# Patient Record
Sex: Male | Born: 1975 | Race: White | Hispanic: No | Marital: Married | Smoking: Never smoker
Health system: Southern US, Community
[De-identification: ages and names within clinical notes are randomized; demographics above are authoritative.]

## PROBLEM LIST (undated history)

## (undated) DIAGNOSIS — I10 Essential (primary) hypertension: Secondary | ICD-10-CM

---

## 2011-03-27 ENCOUNTER — Emergency Department: Payer: Self-pay | Admitting: Emergency Medicine

## 2011-03-27 LAB — COMPREHENSIVE METABOLIC PANEL
Albumin: 4.4 g/dL (ref 3.4–5.0)
Alkaline Phosphatase: 76 U/L (ref 50–136)
Anion Gap: 11 (ref 7–16)
Bilirubin,Total: 0.5 mg/dL (ref 0.2–1.0)
Calcium, Total: 9 mg/dL (ref 8.5–10.1)
Chloride: 105 mmol/L (ref 98–107)
Co2: 26 mmol/L (ref 21–32)
Creatinine: 1.1 mg/dL (ref 0.60–1.30)
EGFR (African American): >60
Glucose: 94 mg/dL (ref 65–99)
Osmolality: 282 (ref 275–301)
Potassium: 4 mmol/L (ref 3.5–5.1)
SGOT(AST): 27 U/L (ref 15–37)
Sodium: 142 mmol/L (ref 136–145)
Total Protein: 7.6 g/dL (ref 6.4–8.2)

## 2011-03-27 LAB — CBC
HCT: 45.5 % (ref 40.0–52.0)
HGB: 15.5 g/dL (ref 13.0–18.0)
MCH: 30.5 pg (ref 26.0–34.0)
MCHC: 34 g/dL (ref 32.0–36.0)
RBC: 5.07 10*6/uL (ref 4.40–5.90)
WBC: 8.4 10*3/uL (ref 3.8–10.6)

## 2011-03-27 LAB — URINALYSIS, COMPLETE
Bilirubin,UR: NEGATIVE
Ketone: NEGATIVE
Protein: NEGATIVE
RBC,UR: 9 /HPF (ref 0–5)
Specific Gravity: 1.017 (ref 1.003–1.030)

## 2011-03-27 LAB — HEMOGLOBIN A1C: Hemoglobin A1C: 5.8 % (ref 4.2–6.3)

## 2018-10-07 ENCOUNTER — Other Ambulatory Visit: Payer: Self-pay

## 2018-10-07 DIAGNOSIS — Z20822 Contact with and (suspected) exposure to covid-19: Secondary | ICD-10-CM

## 2018-10-08 LAB — NOVEL CORONAVIRUS, NAA: SARS-CoV-2, NAA: DETECTED — AB

## 2018-12-05 ENCOUNTER — Other Ambulatory Visit: Payer: Self-pay

## 2018-12-05 ENCOUNTER — Encounter: Payer: Self-pay | Admitting: Emergency Medicine

## 2018-12-05 ENCOUNTER — Emergency Department: Payer: Self-pay

## 2018-12-05 DIAGNOSIS — R079 Chest pain, unspecified: Secondary | ICD-10-CM | POA: Insufficient documentation

## 2018-12-05 DIAGNOSIS — R531 Weakness: Secondary | ICD-10-CM | POA: Insufficient documentation

## 2018-12-05 DIAGNOSIS — R0609 Other forms of dyspnea: Secondary | ICD-10-CM | POA: Insufficient documentation

## 2018-12-05 LAB — BASIC METABOLIC PANEL
Anion gap: 9 (ref 5–15)
BUN: 12 mg/dL (ref 6–20)
CO2: 24 mmol/L (ref 22–32)
Calcium: 9.2 mg/dL (ref 8.9–10.3)
Chloride: 106 mmol/L (ref 98–111)
Creatinine, Ser: 1.22 mg/dL (ref 0.61–1.24)
GFR calc Af Amer: >60 mL/min (ref >60)
GFR calc non Af Amer: >60 mL/min (ref >60)
Glucose, Bld: 92 mg/dL (ref 70–99)
Potassium: 3.9 mmol/L (ref 3.5–5.1)
Sodium: 139 mmol/L (ref 135–145)

## 2018-12-05 LAB — CBC
HCT: 43.8 % (ref 39.0–52.0)
Hemoglobin: 14.9 g/dL (ref 13.0–17.0)
MCH: 30.3 pg (ref 26.0–34.0)
MCHC: 34 g/dL (ref 30.0–36.0)
MCV: 89.2 fL (ref 80.0–100.0)
Platelets: 347 10*3/uL (ref 150–400)
RBC: 4.91 MIL/uL (ref 4.22–5.81)
RDW: 12.2 % (ref 11.5–15.5)
WBC: 8.6 10*3/uL (ref 4.0–10.5)
nRBC: 0 % (ref 0.0–0.2)

## 2018-12-05 LAB — TROPONIN I (HIGH SENSITIVITY)
Troponin I (High Sensitivity): 3 ng/L (ref <18)
Troponin I (High Sensitivity): 9 ng/L (ref <18)

## 2018-12-05 MED ORDER — SODIUM CHLORIDE 0.9% FLUSH: 3.0000 mL | Freq: Once | INTRAVENOUS | Status: DC

## 2018-12-05 NOTE — ED Triage Notes (Signed)
Patient states that he tested positive for covid on Sept 2nd. Patient states that he is not feeling any better. Patient with complaint of weakness and left side chest pain.

## 2018-12-06 ENCOUNTER — Emergency Department: Payer: Self-pay

## 2018-12-06 ENCOUNTER — Emergency Department
Admission: EM | Admit: 2018-12-06 | Discharge: 2018-12-06 | Disposition: A | Discharge status: Home / Self Care | Payer: Self-pay | Attending: Emergency Medicine | Admitting: Emergency Medicine

## 2018-12-06 DIAGNOSIS — R079 Chest pain, unspecified: Secondary | ICD-10-CM

## 2018-12-06 DIAGNOSIS — R531 Weakness: Secondary | ICD-10-CM

## 2018-12-06 DIAGNOSIS — R0609 Other forms of dyspnea: Secondary | ICD-10-CM

## 2018-12-06 DIAGNOSIS — R0602 Shortness of breath: Secondary | ICD-10-CM

## 2018-12-06 NOTE — ED Provider Notes (Signed)
[facility] Emergency Department Provider Note  ____________________________________________   First MD Initiated Contact with Patient 12/06/18 [phone]     (approximate)  I have reviewed the triage vital signs and the nursing notes.   HISTORY  Chief Complaint Weakness and Chest Pain    HPI Juan Cole is a 43 y.o. male who is otherwise healthy but tested positive for COVID-19 about 2 months ago.  He presents tonight by private vehicle for generalized weakness, fatigue, shortness of breath with exertion, and intermittent left-sided chest wall pain that has been present since he was diagnosed.  He says he just does not feel any better and is worried he might have a complication.  He works on a farm and works hard every day, does not smoke, and has no other medical problems.  He said that he gets short of breath very easily and wears out much faster than he used to.  Exertion makes all of the symptoms worse and nothing particular makes them better.  He says he is completely exhausted every night and falls right to sleep.  He has a mild cough that is worse at night but he is always so tired that it does not bother him or prevent him from sleeping.  He denies any recent fever/chills, shortness of breath except associated with exertion, nausea, vomiting, abdominal pain, and dysuria.  He has been eating and drinking normally.         History reviewed. No pertinent past medical history.  There are no active problems to display for this patient.   History reviewed. No pertinent surgical history.  Prior to Admission medications   Not on File    Allergies Codeine  No family history on file.  Social History Social History   Tobacco Use  . Smoking status: Never Smoker  . Smokeless tobacco: Never Used  Substance Use Topics  . Alcohol use: Not on file  . Drug use: Not on file    Review of Systems Constitutional: General malaise and fatigue.  No  fever/chills Eyes: No visual changes. ENT: No sore throat. Cardiovascular: +chest pain since COVID-19 diagnosis about 2 months ago. Respiratory: Shortness of breath with exertion. Gastrointestinal: No abdominal pain.  No nausea, no vomiting.  No diarrhea.  No constipation. Genitourinary: Negative for dysuria. Musculoskeletal: Negative for neck pain.  Negative for back pain. Integumentary: Negative for rash. Neurological: Generalized weakness.  Negative for headaches, focal weakness or numbness.   ____________________________________________   PHYSICAL EXAM:  VITAL SIGNS: ED Triage Vitals  Enc Vitals Group     BP 12/06/18 0108 (!) 129/91     Pulse Rate 12/06/18 0108 63     Resp 12/06/18 0108 18     Temp --      Temp src --      SpO2 12/06/18 0108 98 %     Weight 12/05/18 2000 102.1 kg (225 lb)     Height 12/05/18 2000 1.803 m (5\' 11" )     Head Circumference --      Peak Flow --      Pain Score 12/05/18 2000 5     Pain Loc --      Pain Edu? --      Excl. in GC? --     Constitutional: Alert and oriented.  Healthy body habitus.  Generally well-appearing, no acute distress. Eyes: Conjunctivae are normal.  Head: Atraumatic. Nose: No congestion/rhinnorhea. Mouth/Throat: Patient is wearing a mask. Neck: No stridor.  No meningeal signs.  Cardiovascular: Normal rate, regular rhythm. Good peripheral circulation. Grossly normal heart sounds. Respiratory: Normal respiratory effort.  No retractions. Gastrointestinal: Soft and nontender. No distention.  Musculoskeletal: No reproducible chest wall tenderness to palpation.  No lower extremity tenderness nor edema. No gross deformities of extremities. Neurologic:  Normal speech and language. No gross focal neurologic deficits are appreciated.  Skin:  Skin is warm, dry and intact. Psychiatric: Mood and affect are a little bit depressed but appropriate under the circumstances.  ____________________________________________   LABS  (all labs ordered are listed, but only abnormal results are displayed)  Labs Reviewed  BASIC METABOLIC PANEL  CBC  TROPONIN I (HIGH SENSITIVITY)  TROPONIN I (HIGH SENSITIVITY)   ____________________________________________  EKG  ED ECG REPORT I, Loleta Rose, the attending physician, personally viewed and interpreted this ECG.  Date: 12/05/2018 EKG Time: 20:01 Rate: 72 Rhythm: normal sinus rhythm QRS Axis: normal Intervals: normal ST/T Wave abnormalities: inverted T-waves in lead III Narrative Interpretation: no evidence of acute ischemia  ____________________________________________  RADIOLOGY I, Loleta Rose, personally viewed and evaluated these images (plain radiographs) as part of my medical decision making, as well as reviewing the written report by the radiologist.  ED MD interpretation: No acute abnormalities on chest x-ray  Official radiology report(s): Dg Chest Port 1 View  Result Date: 12/06/2018 CLINICAL DATA:  Chest pain EXAM: PORTABLE CHEST 1 VIEW COMPARISON:  03/28/2011 FINDINGS: The heart size and mediastinal contours are within normal limits. Both lungs are clear. The visualized skeletal structures are unremarkable. IMPRESSION: Negative. Electronically Signed   By: Charlett Nose M.D.   On: 12/06/2018 00:57    ____________________________________________   PROCEDURES   Procedure(s) performed (including Critical Care):  Procedures   ____________________________________________   INITIAL IMPRESSION / MDM / ASSESSMENT AND PLAN / ED COURSE  As part of my medical decision making, I reviewed the following data within the electronic MEDICAL RECORD NUMBER Nursing notes reviewed and incorporated, Labs reviewed , EKG interpreted , Radiograph reviewed  and Notes from prior ED visits   Differential diagnosis includes, but is not limited to, cardiomyopathy, electrolyte or metabolic abnormality, pericarditis/myocarditis, PE, pneumonia, post Covid malaise,  persistent viral syndrome.  The patient is well-appearing and in no distress, normal vitals, no evidence of ischemia on EKG, normal chest x-ray.  Lab results also are within normal limits.  His troponin is 9 and given that his symptoms have been stable for the last 2 months without any spike in his pain tonight I do not feel it is necessary to repeat a troponin.  I had my usual "post Covid" discussion with the patient.  I explained that this disease can lead to weakness compared to baseline and while in some cases it can lead to problems with the heart (cardiomyopathy) sometimes it is just a matter working back up to his previous baseline.  I encouraged him to follow-up with cardiology and discuss whether he might benefit from a stress test and/or an echocardiogram.  I explained there is not much else to do at this time other than for him to continue doing what he is doing, and use over-the-counter ibuprofen or Tylenol as needed for pain control.  He states that he understands and agrees with the plan and will follow up with cardiology.  I gave my usual customary return precautions.      Clinical Course as of Dec 05 132  Mon Dec 06, 2018  0104 Negative chest x-ray with no evidence of infiltrate.  DG Chest Port 1  View [CF]    Clinical Course User Index [CF] Hinda Kehr, MD     ____________________________________________  FINAL CLINICAL IMPRESSION(S) / ED DIAGNOSES  Final diagnoses:  Chest pain, unspecified type  Generalized weakness  Dyspnea on exertion     MEDICATIONS GIVEN DURING THIS VISIT:  Medications  sodium chloride flush (NS) 0.9 % injection 3 mL (has no administration in time range)     ED Discharge Orders    None      *Please note:  Juan Cole was evaluated in Emergency Department on 12/06/2018 for the symptoms described in the history of present illness. He was evaluated in the context of the global COVID-19 pandemic, which necessitated consideration that the  patient might be at risk for infection with the SARS-CoV-2 virus that causes COVID-19. Institutional protocols and algorithms that pertain to the evaluation of patients at risk for COVID-19 are in a state of rapid change based on information released by regulatory bodies including the CDC and federal and state organizations. These policies and algorithms were followed during the patient's care in the ED.  Some ED evaluations and interventions may be delayed as a result of limited staffing during the pandemic.*  Note:  This document was prepared using Dragon voice recognition software and may include unintentional dictation errors.   Hinda Kehr, MD 12/06/18 [phone]

## 2018-12-06 NOTE — Discharge Instructions (Signed)
As we discussed, your lab work, EKG, and chest x-ray are all reassuring tonight.  You most likely are still suffering from the aftereffects of COVID-19.  It may take a long time for your body to get back the strength and energy that you lost while fighting the illness.  Continue to do what you need to do but did not push yourself too much; consider this like training for a race where you have to build yourself back up.  Use over-the-counter Tylenol and/or ibuprofen according to label instructions as needed for pain.  I recommend that you call the cardiologist listed in this paperwork to schedule a follow-up appointment to see if they have some additional testing that they can recommend for you, such as a stress test or an echocardiogram (ultrasound of the heart).    Return to the emergency department if you develop new or worsening symptoms that concern you.

## 2019-03-17 ENCOUNTER — Other Ambulatory Visit
Admission: RE | Admit: 2019-03-17 | Discharge: 2019-03-17 | Disposition: A | Discharge status: Home / Self Care | Payer: Self-pay | Source: Ambulatory Visit | Attending: Student | Admitting: Student

## 2019-03-17 DIAGNOSIS — Z79899 Other long term (current) drug therapy: Secondary | ICD-10-CM | POA: Insufficient documentation

## 2019-03-17 DIAGNOSIS — Z7689 Persons encountering health services in other specified circumstances: Secondary | ICD-10-CM | POA: Insufficient documentation

## 2019-03-17 LAB — BRAIN NATRIURETIC PEPTIDE: B Natriuretic Peptide: 31 pg/mL (ref 0.0–100.0)

## 2019-03-18 ENCOUNTER — Other Ambulatory Visit: Payer: Self-pay | Admitting: Internal Medicine

## 2019-03-18 DIAGNOSIS — R079 Chest pain, unspecified: Secondary | ICD-10-CM

## 2019-03-18 DIAGNOSIS — I2699 Other pulmonary embolism without acute cor pulmonale: Secondary | ICD-10-CM

## 2019-03-23 ENCOUNTER — Other Ambulatory Visit: Payer: Self-pay

## 2019-03-23 ENCOUNTER — Ambulatory Visit
Admission: RE | Admit: 2019-03-23 | Discharge: 2019-03-23 | Disposition: A | Discharge status: Home / Self Care | Payer: Self-pay | Source: Ambulatory Visit | Attending: Internal Medicine | Admitting: Internal Medicine

## 2019-03-23 DIAGNOSIS — I2699 Other pulmonary embolism without acute cor pulmonale: Secondary | ICD-10-CM | POA: Insufficient documentation

## 2019-03-23 DIAGNOSIS — R079 Chest pain, unspecified: Secondary | ICD-10-CM | POA: Insufficient documentation

## 2019-03-23 HISTORY — DX: Essential (primary) hypertension: I10

## 2019-03-23 MED ORDER — IOHEXOL 350 MG/ML SOLN
100.0000 mL | Freq: Once | INTRAVENOUS | Status: AC | PRN
  Administered 2019-03-23: 75 mL via INTRAVENOUS

## 2019-03-25 ENCOUNTER — Other Ambulatory Visit: Payer: Self-pay | Admitting: Internal Medicine

## 2019-03-25 DIAGNOSIS — R0602 Shortness of breath: Secondary | ICD-10-CM

## 2019-03-28 ENCOUNTER — Other Ambulatory Visit: Payer: Self-pay | Admitting: Student

## 2019-03-28 DIAGNOSIS — I208 Other forms of angina pectoris: Secondary | ICD-10-CM

## 2019-03-28 DIAGNOSIS — R0602 Shortness of breath: Secondary | ICD-10-CM

## 2019-04-14 ENCOUNTER — Ambulatory Visit
Admission: RE | Admit: 2019-04-14 | Discharge: 2019-04-14 | Disposition: A | Discharge status: Home / Self Care | Payer: Self-pay | Source: Ambulatory Visit | Attending: Internal Medicine | Admitting: Internal Medicine

## 2019-04-14 ENCOUNTER — Encounter
Admission: RE | Admit: 2019-04-14 | Discharge: 2019-04-14 | Disposition: A | Discharge status: Home / Self Care | Payer: Self-pay | Source: Ambulatory Visit | Attending: Student | Admitting: Student

## 2019-04-14 ENCOUNTER — Other Ambulatory Visit: Payer: Self-pay

## 2019-04-14 DIAGNOSIS — I10 Essential (primary) hypertension: Secondary | ICD-10-CM | POA: Insufficient documentation

## 2019-04-14 DIAGNOSIS — Z8616 Personal history of COVID-19: Secondary | ICD-10-CM | POA: Insufficient documentation

## 2019-04-14 DIAGNOSIS — I208 Other forms of angina pectoris: Secondary | ICD-10-CM | POA: Insufficient documentation

## 2019-04-14 DIAGNOSIS — R0602 Shortness of breath: Secondary | ICD-10-CM | POA: Insufficient documentation

## 2019-04-14 MED ORDER — TECHNETIUM TC 99M TETROFOSMIN IV KIT
30.7140 | PACK | Freq: Once | INTRAVENOUS | Status: AC | PRN
  Administered 2019-04-14: 30.714 via INTRAVENOUS

## 2019-04-14 MED ORDER — REGADENOSON 0.4 MG/5ML IV SOLN
0.4000 mg | Freq: Once | INTRAVENOUS | Status: AC
  Administered 2019-04-14: 0.4 mg via INTRAVENOUS

## 2019-04-14 MED ORDER — TECHNETIUM TC 99M TETROFOSMIN IV KIT
10.7000 | PACK | Freq: Once | INTRAVENOUS | Status: AC | PRN
  Administered 2019-04-14: 10.7 via INTRAVENOUS

## 2019-04-14 NOTE — Progress Notes (Signed)
*  PRELIMINARY RESULTS* Echocardiogram 2D Echocardiogram has been performed.  Joanette Gula Jaquavian Firkus 04/14/2019, 11:30 AM

## 2019-04-15 LAB — NM MYOCAR MULTI W/SPECT W/WALL MOTION / EF
Estimated workload: 1 METS
Exercise duration (min): 1 min
Exercise duration (sec): 9 s
LV dias vol: 85 mL (ref 62–150)
LV sys vol: 34 mL
Peak HR: 116 {beats}/min
Percent HR: 64 %
Rest HR: 69 {beats}/min
SDS: 0
SRS: 6
SSS: 2
TID: 1.02

## 2020-12-21 IMAGING — CT CT ANGIO CHEST
1 of 8 series · 16 of 29 positions shown · IV contrast (omnipaque)
Comparison: None.

CLINICAL DATA: Shortness of breath, COVID-23 October, 2018

EXAM:
CT ANGIOGRAPHY CHEST WITH CONTRAST
TECHNIQUE: Multidetector CT imaging of the chest was performed using the
standard protocol during bolus administration of intravenous
contrast. Multiplanar CT image reconstructions and MIPs were
obtained to evaluate the vascular anatomy.
CONTRAST:  75mL OMNIPAQUE IOHEXOL 350 MG/ML SOLN

[Series 14: thins · axial · 0.70mm/px · z∈[-602,-334]mm · 16 of 304 slices shown]
[im 18/304  lung]
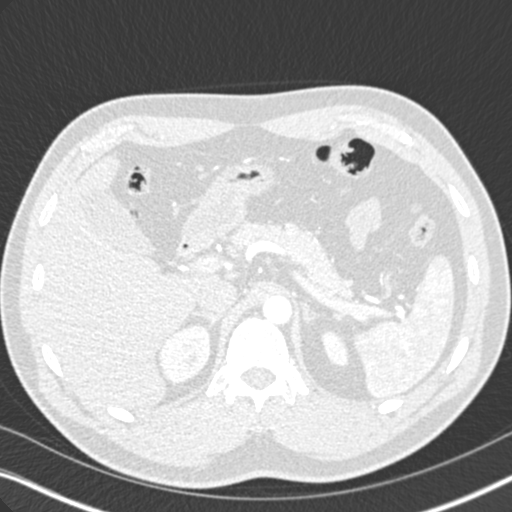
[im 36/304  mediastinal]
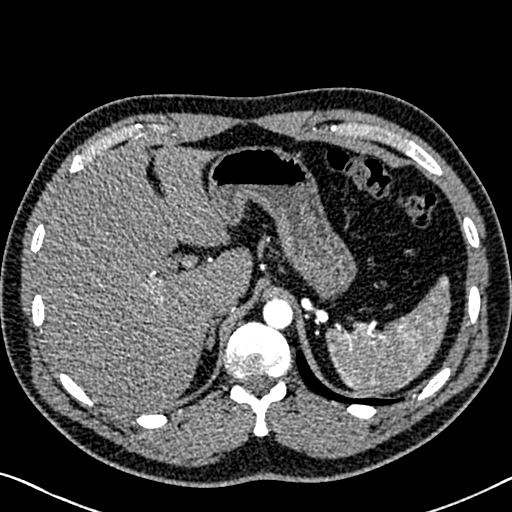
[im 54/304  lung]
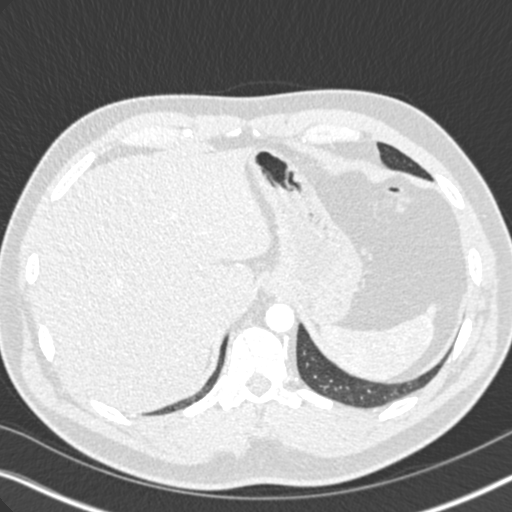
[im 72/304  mediastinal]
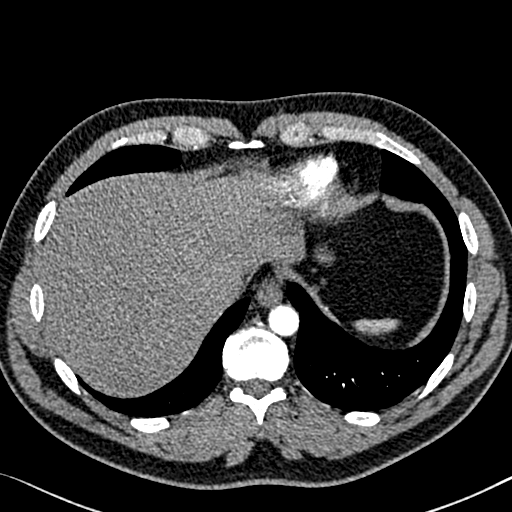
[im 90/304  lung]
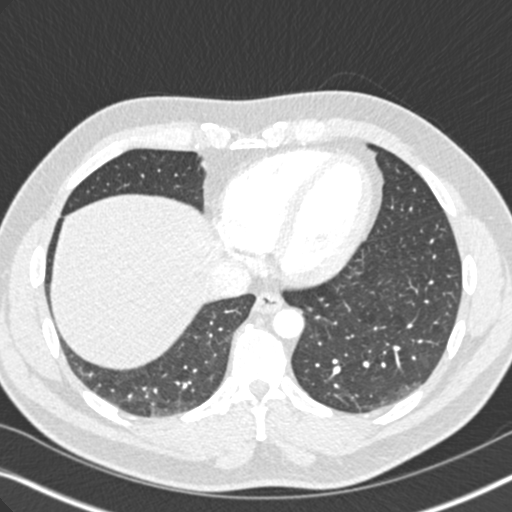
[im 107/304  mediastinal]
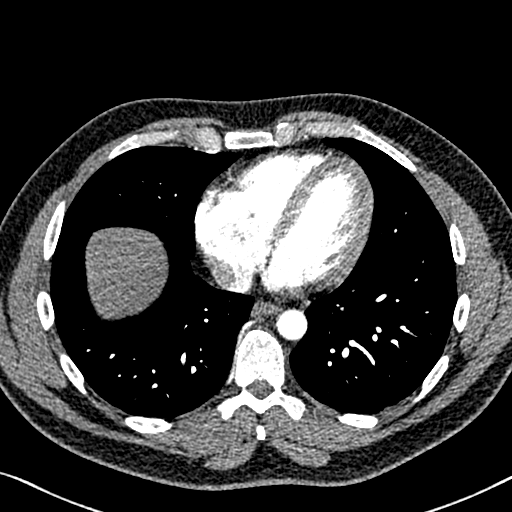
[im 125/304  lung]
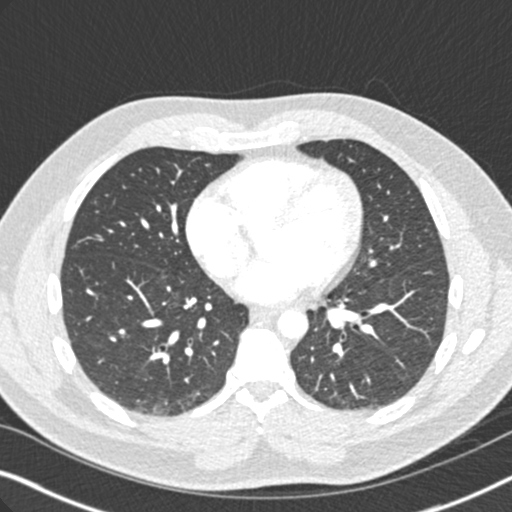
[im 143/304  mediastinal]
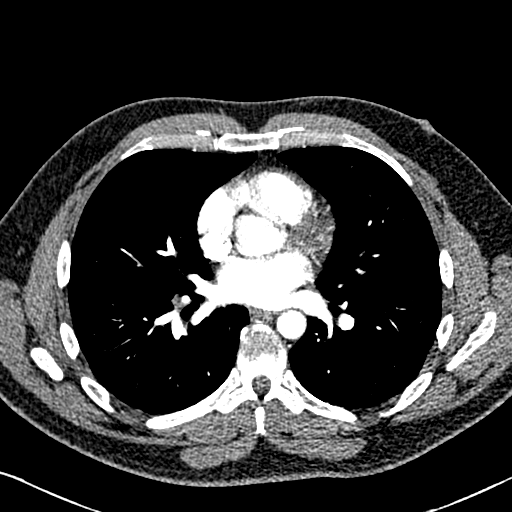
[im 161/304  lung]
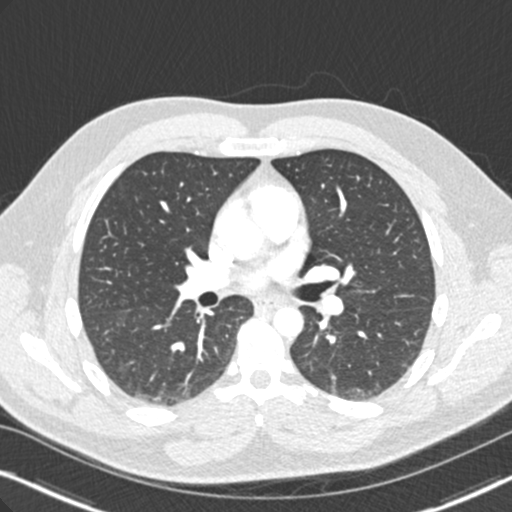
[im 179/304  mediastinal]
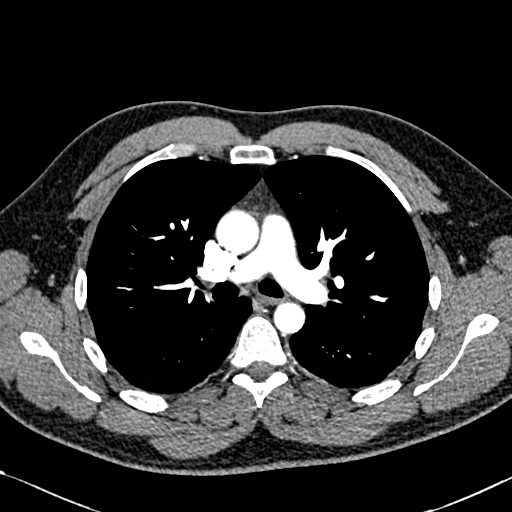
[im 197/304  lung]
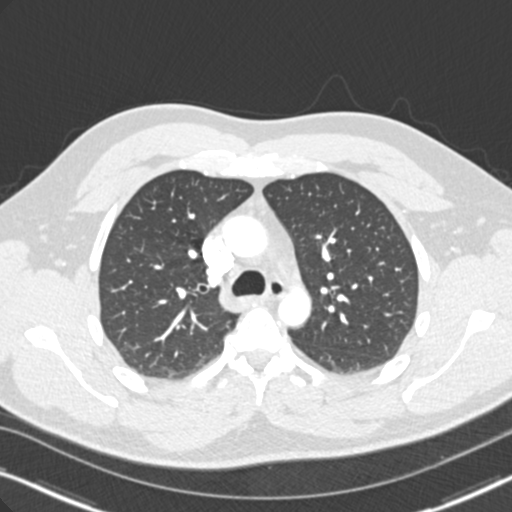
[im 214/304  mediastinal]
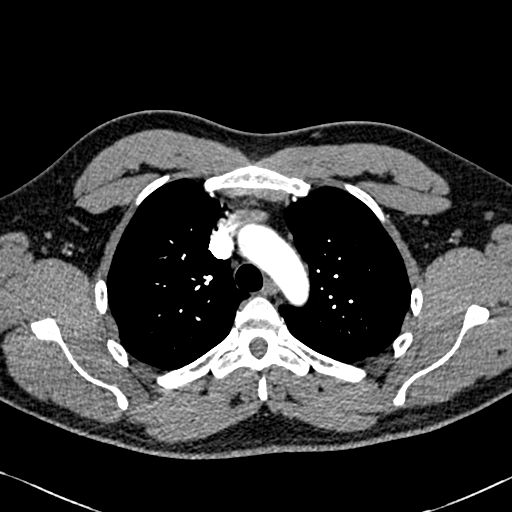
[im 232/304  lung]
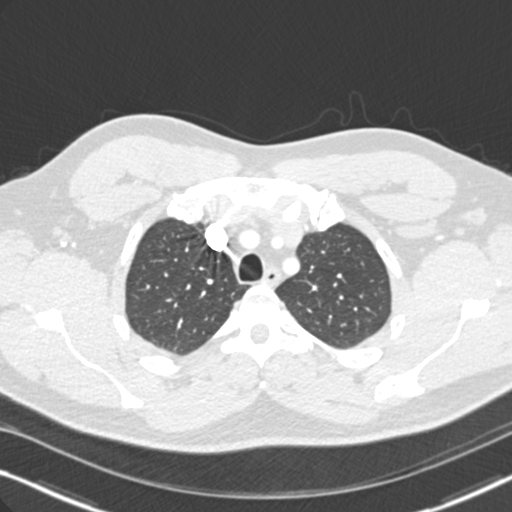
[im 250/304  mediastinal]
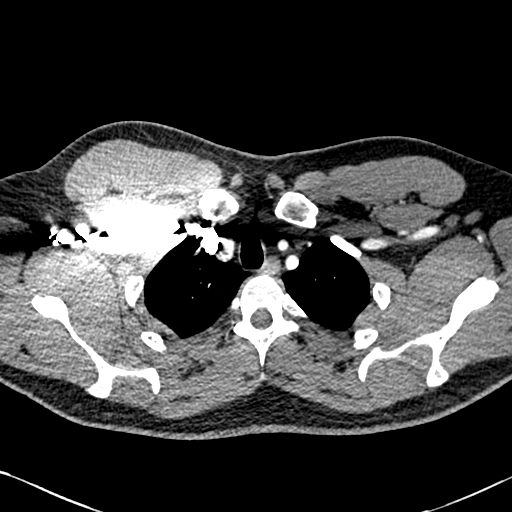
[im 268/304  lung]
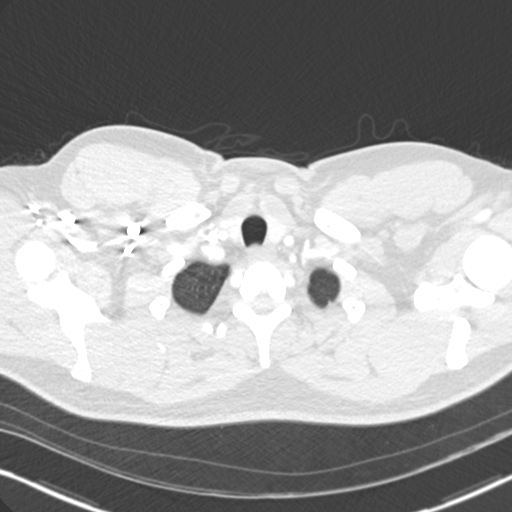
[im 286/304  mediastinal]
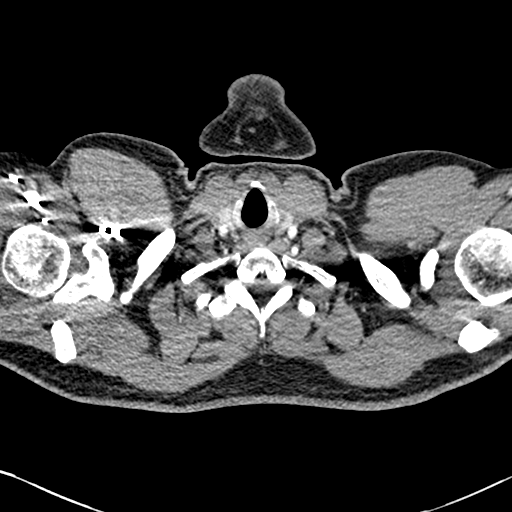

[16 of 29 positions shown; findings below may reference images not displayed]

FINDINGS: Cardiovascular: Satisfactory opacification of the pulmonary arteries
to the segmental level. No evidence of pulmonary embolism. Normal
heart size. No pericardial effusion. Normal caliber thoracic aorta.
No thoracic aortic dissection.

Mediastinum/Nodes: No enlarged mediastinal, hilar, or axillary lymph
nodes. Thyroid gland, trachea, and esophagus demonstrate no
significant findings.

Lungs/Pleura: Lungs are clear. No pleural effusion or pneumothorax.

Upper Abdomen: No acute abnormality.

Musculoskeletal: No chest wall abnormality. No acute or significant
osseous findings.

Review of the MIP images confirms the above findings.
IMPRESSION: 1. No acute cardiopulmonary disease.
2. No pulmonary embolus.
